# Patient Record
Sex: Male | Born: 1945 | Race: White | Hispanic: No | Marital: Married | Smoking: Former smoker
Health system: Southern US, Community
[De-identification: ages and names within clinical notes are randomized; demographics above are authoritative.]

## PROBLEM LIST (undated history)

## (undated) DIAGNOSIS — I10 Essential (primary) hypertension: Secondary | ICD-10-CM

## (undated) DIAGNOSIS — M5430 Sciatica, unspecified side: Secondary | ICD-10-CM

## (undated) DIAGNOSIS — E7800 Pure hypercholesterolemia, unspecified: Secondary | ICD-10-CM

## (undated) DIAGNOSIS — C801 Malignant (primary) neoplasm, unspecified: Secondary | ICD-10-CM

## (undated) DIAGNOSIS — E119 Type 2 diabetes mellitus without complications: Secondary | ICD-10-CM

## (undated) HISTORY — PX: CORONARY STENT PLACEMENT: SHX1402

## (undated) HISTORY — PX: OTHER SURGICAL HISTORY: SHX169

---

## 2014-03-23 ENCOUNTER — Emergency Department (HOSPITAL_COMMUNITY)
Admission: EM | Admit: 2014-03-23 | Discharge: 2014-03-23 | Disposition: A | Discharge status: Home / Self Care | Payer: Medicare Other | Attending: Emergency Medicine | Admitting: Emergency Medicine

## 2014-03-23 ENCOUNTER — Encounter (HOSPITAL_COMMUNITY): Payer: Self-pay | Admitting: Emergency Medicine

## 2014-03-23 DIAGNOSIS — Z859 Personal history of malignant neoplasm, unspecified: Secondary | ICD-10-CM | POA: Insufficient documentation

## 2014-03-23 DIAGNOSIS — E119 Type 2 diabetes mellitus without complications: Secondary | ICD-10-CM | POA: Insufficient documentation

## 2014-03-23 DIAGNOSIS — IMO0001 Reserved for inherently not codable concepts without codable children: Secondary | ICD-10-CM | POA: Insufficient documentation

## 2014-03-23 DIAGNOSIS — E785 Hyperlipidemia, unspecified: Secondary | ICD-10-CM | POA: Diagnosis not present

## 2014-03-23 DIAGNOSIS — Z87891 Personal history of nicotine dependence: Secondary | ICD-10-CM | POA: Diagnosis not present

## 2014-03-23 DIAGNOSIS — Z9889 Other specified postprocedural states: Secondary | ICD-10-CM | POA: Diagnosis not present

## 2014-03-23 DIAGNOSIS — M791 Myalgia, unspecified site: Secondary | ICD-10-CM

## 2014-03-23 DIAGNOSIS — M79609 Pain in unspecified limb: Secondary | ICD-10-CM | POA: Insufficient documentation

## 2014-03-23 DIAGNOSIS — I10 Essential (primary) hypertension: Secondary | ICD-10-CM | POA: Diagnosis not present

## 2014-03-23 DIAGNOSIS — R209 Unspecified disturbances of skin sensation: Secondary | ICD-10-CM | POA: Diagnosis not present

## 2014-03-23 DIAGNOSIS — Z8739 Personal history of other diseases of the musculoskeletal system and connective tissue: Secondary | ICD-10-CM | POA: Insufficient documentation

## 2014-03-23 HISTORY — DX: Essential (primary) hypertension: I10

## 2014-03-23 HISTORY — DX: Type 2 diabetes mellitus without complications: E11.9

## 2014-03-23 HISTORY — DX: Malignant (primary) neoplasm, unspecified: C80.1

## 2014-03-23 HISTORY — DX: Sciatica, unspecified side: M54.30

## 2014-03-23 HISTORY — DX: Pure hypercholesterolemia, unspecified: E78.00

## 2014-03-23 NOTE — Progress Notes (Signed)
VASCULAR LAB PRELIMINARY  PRELIMINARY  PRELIMINARY  PRELIMINARY  Bilateral lower extremity venous Dopplers completed.    Preliminary report:  There is no DVT or SVT noted in the bilateral lower extremities.  Incidentally, lower extremity arteries show no significant plaque and no stenosis.    Johnny Bailey, RVT 03/23/2014, 3:08 PM

## 2014-03-23 NOTE — ED Notes (Signed)
Pt complains of bilateral leg cramping, seen md for same and had labs drawn and K was fine, pt sts almost feels like pulled groin

## 2014-03-23 NOTE — ED Notes (Signed)
Pt returned to room for vascular

## 2014-03-23 NOTE — ED Provider Notes (Signed)
Medical screening examination/treatment/procedure(s) were performed by non-physician practitioner and as supervising physician I was immediately available for consultation/collaboration.  Richarda Blade, MD 03/23/14 [phone]

## 2014-03-23 NOTE — ED Notes (Signed)
Called vascular studies and updated pt on status

## 2014-03-23 NOTE — ED Provider Notes (Signed)
CSN: [id number]     Arrival date & time 03/23/14  1108 History  This chart was scribed for non-physician practitioner, Alecia Lemming, PA-C working with Richarda Blade, MD, by Erling Conte, ED Scribe. This patient was seen in room TR11C/TR11C and the patient's care was started at 11:38 AM.    Chief Complaint  Patient presents with  . Leg Pain      The history is provided by the patient. No language interpreter was used.   HPI Comments: Johnny Bailey is a 68 y.o. male with a h/o HTN, high cholesterol, DM, cancer, coronary stent placement and sciatica who presents to the Emergency Department complaining of mild, gradually worsening, intermittent, "4/10",  bilateral leg cramps for 3 months. He states left side is worse than the right side. He states he is having associated mild numbness in his bilateral thighs. He states that the pain is exacerbated by movement and it has become a nuisance becomes he has trouble getting in and out of his car and other normal activities. States that their is some mild relief with lying down but not a lot.  He states that he saw his PCP for the same and had lab drawn and his Potassium level was fine and he was unable to get an X-ray scheduled due to the x-ray tech not being in the office.  He denies any recent travel or long car rides. Denies taking any medication for the pain. He states that he regularly takes cholesterol medication but is unable to recall the name but states he has been taking it for a while. He denies any change in urine, weakness, shortness of breath, edema in bilateral legs, back pain, fever, chills, or chest pain. Reports that he stays adequately hydrated throughout the day.    Past Medical History  Diagnosis Date  . Hypertension   . High cholesterol   . Diabetes mellitus without complication   . Cancer   . Sciatica    Past Surgical History  Procedure Laterality Date  . Coronary stent placement     History reviewed. No  pertinent family history. History  Substance Use Topics  . Smoking status: Former Research scientist (life sciences)  . Smokeless tobacco: Not on file  . Alcohol Use: Yes    Review of Systems  Constitutional: Negative for fever and chills.  HENT: Negative for rhinorrhea and sore throat.   Eyes: Negative for redness.  Respiratory: Negative for cough and shortness of breath.   Cardiovascular: Negative for chest pain and leg swelling.  Gastrointestinal: Negative for nausea, vomiting, abdominal pain and diarrhea.  Genitourinary: Negative for dysuria and hematuria.  Musculoskeletal: Positive for gait problem (cramps and pain when walking) and myalgias (bilateral leg cramping). Negative for back pain.  Skin: Negative for rash.  Neurological: Positive for numbness (mild in bilateral thighs). Negative for weakness and headaches.      Allergies  Review of patient's allergies indicates not on file.  Home Medications   Prior to Admission medications   Medication Sig Start Date End Date Taking? Authorizing Provider  MORPHINE SULFATE, PF, IV Inject into the vein.   Yes Historical Provider, MD   BP 155/92  Pulse 82  Temp(Src) 98.1 F (36.7 C) (Oral)  Resp 18  Ht 6\' 1"  (1.854 m)  Wt 280 lb (127.007 kg)  BMI 36.95 kg/m2  SpO2 98%  Physical Exam  Nursing note and vitals reviewed. Constitutional: He is oriented to person, place, and time. He appears well-developed and well-nourished. No distress.  HENT:  Head: Normocephalic and atraumatic.  Eyes: Conjunctivae and EOM are normal. Right eye exhibits no discharge. Left eye exhibits no discharge.  Neck: Normal range of motion. Neck supple. No tracheal deviation present.  Cardiovascular: Normal rate, regular rhythm and normal heart sounds.   No murmur heard. Pulses:      Dorsalis pedis pulses are 2+ on the right side, and 2+ on the left side.       Posterior tibial pulses are 2+ on the right side, and 2+ on the left side.  Pulmonary/Chest: Effort normal and  breath sounds normal. No respiratory distress.  Abdominal: Soft. There is no tenderness.  Musculoskeletal: Normal range of motion. He exhibits no tenderness.  No pain to the paraspinous muscles or of the spine itself.  Neurological: He is alert and oriented to person, place, and time.  Skin: Skin is warm and dry.  Psychiatric: He has a normal mood and affect. His behavior is normal.    ED Course  Procedures (including critical care time)  DIAGNOSTIC STUDIES: Oxygen Saturation is 98% on RA, normal by my interpretation.    COORDINATION OF CARE: 11:49 PM- Will order [place] of LE venous duplex bilaterally. Pt advised of plan for treatment and pt agrees.  Doppler was negative. Patient informed. He will followup with PCP for continued evaluation.   Labs Review Labs Reviewed - No data to display  Imaging Review No results found.   EKG Interpretation None      Vital signs reviewed and are as follows: Filed Vitals:   03/23/14 1540  BP: 133/100  Pulse: 86  Temp:   Resp: 18    MDM   Final diagnoses:  Myalgia   Patient with myalgias of bilateral lower extremities. Patient had previous blood work done by PCP which included a normal CK, normal electrolytes. Patient brings with him orders from his PCP for lower extremity Doppler, arterial Doppler/ABI. Patient was unable to get the scheduled for today and he is worried about clots in his legs, so he came to the emergency department for evaluation. Venous Doppler was negative. Incidentally, no stenosis or plaque noted in arterial circulation of legs. Patient symptoms do not suggest claudication. Do not really suggest radicular pain either. Patient is on a statin medication. At this point we discussed that there is no other emergent studies needed at this time. He will follow up with his PCP for further evaluation.   I personally performed the services described in this documentation, which was scribed in my presence. The recorded  information has been reviewed and is accurate.      Carlisle Cater, PA-C 03/23/14 1700

## 2014-03-23 NOTE — Discharge Instructions (Signed)
Please read and follow all provided instructions.  Your diagnoses today include:  1. Myalgia    Tests performed today include:  Ultrasound of your legs - no blood clots or blockages  Vital signs. See below for your results today.   Medications prescribed:   None  Take any prescribed medications only as directed.  Home care instructions:  Follow any educational materials contained in this packet.  BE VERY CAREFUL not to take multiple medicines containing Tylenol (also called acetaminophen). Doing so can lead to an overdose which can damage your liver and cause liver failure and possibly death.   Follow-up instructions: Please follow-up with your primary care provider in the next 3 days for further evaluation of your symptoms.   Return instructions:   Please return to the Emergency Department if you experience worsening symptoms.   Please return if you have any other emergent concerns.  Additional Information:  Your vital signs today were: BP 155/92   Pulse 82   Temp(Src) 98.1 F (36.7 C) (Oral)   Resp 18   Ht 6\' 1"  (1.854 m)   Wt 280 lb (127.007 kg)   BMI 36.95 kg/m2   SpO2 98% If your blood pressure (BP) was elevated above 135/85 this visit, please have this repeated by your doctor within one month. --------------

## 2014-04-01 ENCOUNTER — Encounter: Payer: Self-pay | Admitting: Neurology

## 2014-04-01 ENCOUNTER — Ambulatory Visit (INDEPENDENT_AMBULATORY_CARE_PROVIDER_SITE_OTHER): Payer: Medicare Other | Admitting: Neurology

## 2014-04-01 VITALS — BP 139/84 | HR 84 | Temp 98.8°F | Ht 76.0 in | Wt 274.0 lb

## 2014-04-01 DIAGNOSIS — G729 Myopathy, unspecified: Secondary | ICD-10-CM

## 2014-04-01 DIAGNOSIS — R52 Pain, unspecified: Secondary | ICD-10-CM

## 2014-04-01 DIAGNOSIS — R252 Cramp and spasm: Secondary | ICD-10-CM

## 2014-04-01 NOTE — Patient Instructions (Signed)
We will do further testing for your thigh pain: nerve and muscle electrical testing, blood work and urine test, and will consider a muscle biopsy down the road.

## 2014-04-01 NOTE — Progress Notes (Signed)
Subjective:    Patient ID: Johnny Bailey is a 68 y.o. male.  HPI    Star Age, MD, PhD [facility] [address] [place], [place] [address]  Dear Dr. Harmon Pier,   I saw your patient, Johnny Bailey, upon your kind request in my neurologic clinic today for initial consultation of his leg pain. The patient is unaccompanied today. As you know, Johnny Bailey is a very friendly 69 year old right-handed gentleman with a complex medical history of hypertension, hyperlipidemia, diabetes, sciatica, cancer, and coronary artery disease, status post stent placement who presented on 03/23/2014 to the emergency room with a history of intermittent and progressive leg cramps for the past 3 to 4 months. He feels that the left side is worse than the right. He felt mild numbness in his bilateral thighs. He describes exacerbating factors of movement and while overall his symptoms are more of a nuisance he feels he is getting weaker. He did have bilateral lower extremity Doppler studies which showed no clot. I reviewed labs from your office from 03/18/2014 which included a normal BMP with the exception of borderline low sodium at 134, ESR borderline elevated at 19, normal total CK, normal CRP, normal aldolase, normal CBC with differential which was done on 12/24/2013, he had a normal cholesterol panel and negative UA in May as well as normal hemoglobin A1c at 5.5. Sx started gradually some months ago and are primarily crampy pain or discomfort in the thighs and calves and he has not noted any wasting, no fasciculations, no rash, no numbness, no burning, but has at times severe pain, particularly when climbing up stairs. He has no FHx of neuropathy or myopathy. 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 He quit smoking and has largely reduced his caffeine, he drinks a cocktail plus an occasional beer daily.    He is overweight and has had no recent wt changes.  He has no UE symptoms and pain is the biggest player. He also reports a longer standing history of lower back pain, particularly with right sciatica symptoms.              His Past Medical History Is Significant For: Past Medical History  Diagnosis Date  . Hypertension   . High cholesterol   . Diabetes mellitus without complication   . Sciatica   . Cancer     bladder    His Past Surgical History Is Significant For: Past Surgical History  Procedure Laterality Date  . Coronary stent placement    . Bladder cancer      His Family History Is Significant For: No family history on file.  His Social History Is Significant For: History   Social History  . Marital Status: Married    Spouse Name: Lelan Pons    Number of Children: 4  . Years of Education: BME   Occupational History  .      retired   Social History Main Topics  . Smoking status: Former Research scientist (life sciences)  . Smokeless tobacco: Never Used  . Alcohol Use: Yes     Comment: moderate  . Drug Use: No  . Sexual Activity: None   Other Topics Concern  . None   Social History Narrative   No caffeine,is right handed and resides with wife  His Allergies Are:  Allergies  Allergen Reactions  . Morphine And Related Itching and Other (See Comments)    IV injection gave red streaks on arm  :   His Current Medications Are:  Outpatient Encounter Prescriptions as of 04/01/2014  Medication Sig  . aspirin 325 MG tablet Take 325 mg by mouth daily.  Marland Kitchen atorvastatin (LIPITOR) 40 MG tablet Take 40 mg by mouth daily.  . benazepril (LOTENSIN) 20 MG tablet Take 20 mg by mouth daily.  . Flaxseed, Linseed, (FLAX SEED OIL PO) Take 1 capsule by mouth daily.  . hydrochlorothiazide (HYDRODIURIL) 25 MG tablet Take 25 mg by mouth daily.  . metoprolol (LOPRESSOR) 50 MG tablet Take 50 mg  by mouth daily.  . Omega-3 Fatty Acids (FISH OIL PO) Take 1 capsule by mouth daily.  Marland Kitchen omeprazole (PRILOSEC) 20 MG capsule Take 20 mg by mouth daily.  :   Review of Systems:  Out of a complete 14 point review of systems, all are reviewed and negative with the exception of these symptoms as listed below:   Review of Systems  Musculoskeletal:       Joint pain, cramps, aching muscles    Objective:  Neurologic Exam  Physical Exam Physical Examination:   Filed Vitals:   04/01/14 0849  BP: 139/84  Pulse: 84  Temp: 98.8 F (37.1 C)    General Examination: The patient is a very pleasant 68 y.o. male in no acute distress. He appears well-developed and well-nourished and adequately groomed.   HEENT: Normocephalic, atraumatic, pupils are equal, round and reactive to light and accommodation. Funduscopic exam is normal with sharp disc margins noted. Extraocular tracking is good without limitation to gaze excursion or nystagmus noted. Normal smooth pursuit is noted. Hearing is grossly intact. Tympanic membranes are clear bilaterally. Face is symmetric with normal facial animation and normal facial sensation. Speech is clear with no dysarthria noted. There is no hypophonia. There is no lip, neck/head, jaw or voice tremor. Neck is supple with full range of passive and active motion. There are no carotid bruits on auscultation. Oropharynx exam reveals: mild mouth dryness, adequate dental hygiene and moderate airway crowding. Mallampati is class II. Tongue protrudes centrally and palate elevates symmetrically.   Chest: Clear to auscultation without wheezing, rhonchi or crackles noted.  Heart: S1+S2+0, regular and normal without murmurs, rubs or gallops noted.   Abdomen: Soft, non-tender and non-distended with normal bowel sounds appreciated on auscultation.  Extremities: There is no pitting edema in the distal lower extremities bilaterally. Pedal pulses are intact.  Skin: Warm and dry without  trophic changes noted. There are no varicose veins.  Musculoskeletal: exam reveals no obvious joint deformities, tenderness or joint swelling or erythema.   Neurologically:  Mental status: The patient is awake, alert and oriented in all 4 spheres. His immediate and remote memory, attention, language skills and fund of knowledge are appropriate. There is no evidence of aphasia, agnosia, apraxia or anomia. Speech is clear with normal prosody and enunciation. Thought process is linear. Mood is normal and affect is normal.  Cranial nerves II - XII are as described above under HEENT exam. In addition: shoulder shrug is normal with equal shoulder height noted. Motor exam: Normal bulk, strength and tone is noted. I do not appreciate any tremors, no drift, Romberg is negative, upper body strength is completely normal, lower extremity strength is just impaired because of significant pain reported in the hip flexors only. Otherwise he has no significant pain. I do  not see any focal atrophy, no fasciculations, and no muscle cramping. Reflexes are 2+ throughout. Babinski: Toes are flexor bilaterally. Fine motor skills and coordination: intact with normal finger taps, normal hand movements, normal rapid alternating patting, normal foot taps and normal foot agility.  Cerebellar testing: No dysmetria or intention tremor on finger to nose testing. Heel to shin is very difficult for him due to pain. He is barely able to bring up his heels to the mid shin areas. There is no truncal or gait ataxia.  Sensory exam: intact to light touch, pinprick, vibration, temperature sense in the upper and lower extremities.  Gait, station and balance: He stands with difficulty. No veering to one side is noted. No leaning to one side is noted. Posture is age-appropriate and stance is narrow based. Gait shows reduced pace and slightly reduced stride length. He seems to be in pain when walking and has a limp but not consistently on one side.  Interestingly pain seems to be a very big players year as opposed to weakness. normal stride length and normal pace. No problems turning are noted. He turns en bloc. Tandem walk is quite difficult for him but he seems to be in pain more than anything else. Balance seems to be preserved. Intact toe and heel stance is noted.   Assessment and Plan:    In summary, Ceaser Ebeling is a very pleasant 68 y.o.-year old male with a history of hypertension, hyperlipidemia, diabetes, sciatica, cancer, and coronary artery disease, status post stent placement, who reports a 3-4 month history of progressive thigh pain. Symptoms are not in keeping with meralgia paresthetica. He has resultant mild weakness in the hip flexors but on examination pain is a very big component and stands out of proportion to his weakness were any other signs. In fact he has a fairly nonfocal exam otherwise. I am somewhat perplexed as to his symptomatology. He has no family history of nerve or muscle disease, he is not diabetic. CK levels were fine. Of course there are rare instances of inflammatory muscle disease as such as inclusion body myositis that can present with primarily hip flexor and thigh muscle weakness and pain but weakness is usually a bigger component and it also typically affects finger intrinsic muscles. He has no upper extremity symptoms which is interesting. Reflexes are preserved. I had a long chat with the patient about my findings and his symptoms. I think we should proceed with further testing in the form of lumbar spine MRI as he reports right-sided sciatica, EMG and nerve conduction velocity testing, additional blood testing for inflammatory markers, autoimmune diseases, SPEP and IFE, and heavy metal testing. We will be calling him with his test results as we get that back. Down the Road we may consider muscle biopsy. At this juncture, for symptomatic treatment he has had reasonable success with Motrin. I did not  suggest any new medication and would like to await at least some test results. I will see him back soon.  Thank you very much for allowing me to participate in the care of this nice patient. If I can be of any further assistance to you please do not hesitate to call me at [phone].  Sincerely,   Star Age, MD, PhD

## 2014-04-02 ENCOUNTER — Other Ambulatory Visit: Payer: Self-pay | Admitting: Neurology

## 2014-04-02 ENCOUNTER — Ambulatory Visit
Admission: RE | Admit: 2014-04-02 | Discharge: 2014-04-02 | Disposition: A | Discharge status: Home / Self Care | Payer: Medicare Other | Source: Ambulatory Visit | Attending: Neurology | Admitting: Neurology

## 2014-04-02 DIAGNOSIS — G729 Myopathy, unspecified: Secondary | ICD-10-CM

## 2014-04-02 DIAGNOSIS — R252 Cramp and spasm: Secondary | ICD-10-CM

## 2014-04-02 DIAGNOSIS — R52 Pain, unspecified: Secondary | ICD-10-CM

## 2014-04-03 NOTE — Progress Notes (Signed)
Quick Note:  I called pt and relayed the results of the lab tests. He turned in urine specimen for heavy metals this am. He is asking about getting MRI in [place] at the [facility] as well he is claustrophobic and I mentioned can get xanax called in to [facility] in [place] if ok per Dr.Athar. Pt verbalized understanding. ______

## 2014-04-03 NOTE — Progress Notes (Signed)
Rx has been phoned in to the pharmacy.  I called the patient to advise. Got no answer, left message.

## 2014-04-03 NOTE — Progress Notes (Signed)
Okay to call in Xanax sure 0.5 mg strength for MRI due to claustrophobia. May take one pill on call to MRI, may repeat x1, please remind patient not to drive after his MRI he is not used to taking Xanax. He may have somebody bring him and pick them up to the MRI. Jessica: Please call in Xanax her 0.5 mg, 1-2 pills as needed for MRI, #2 with no refills

## 2014-04-03 NOTE — Progress Notes (Signed)
Quick Note:  Please call and advise the patient that the recent labs we checked were within normal limits. We checked thyroid function, inflammatory markers, sedimentation rate, Lyme disease antibodies and heavy metals. No further action is required on these tests at this time. Please remind patient to keep any upcoming appointments or tests and to call us with any interim questions, concerns, problems or updates. Thanks,  Star Age, MD, PhD    ______

## 2014-04-06 LAB — IFE AND PE, SERUM
Albumin SerPl Elph-Mcnc: 3.6 g/dL (ref 3.2–5.6)
Albumin/Glob SerPl: 1.2 (ref 0.7–2.0)
Alpha 1: 0.3 g/dL (ref 0.1–0.4)
Alpha2 Glob SerPl Elph-Mcnc: 0.7 g/dL (ref 0.4–1.2)
B-GLOBULIN SERPL ELPH-MCNC: 1 g/dL (ref 0.6–1.3)
GAMMA GLOB SERPL ELPH-MCNC: 1.3 g/dL (ref 0.5–1.6)
GLOBULIN, TOTAL: 3.2 g/dL (ref 2.0–4.5)
IGA/IMMUNOGLOBULIN A, SERUM: 229 mg/dL (ref 91–414)
IGG (IMMUNOGLOBIN G), SERUM: 1092 mg/dL (ref 700–1600)
IgM (Immunoglobulin M), Srm: 229 mg/dL (ref 40–230)
Total Protein: 6.8 g/dL (ref 6.0–8.5)

## 2014-04-06 LAB — TSH: TSH: 1.36 u[IU]/mL (ref 0.450–4.500)

## 2014-04-06 LAB — COPPER, SERUM: COPPER: 116 ug/dL (ref 72–166)

## 2014-04-06 LAB — ANA W/REFLEX: Anti Nuclear Antibody(ANA): NEGATIVE

## 2014-04-06 LAB — HEAVY METALS PROFILE II, BLOOD
Arsenic: 3 ug/L (ref 2–23)
Cadmium: NOT DETECTED ug/L (ref 0.0–1.2)
Lead, Blood: 2 ug/dL (ref 0–19)
Mercury: 1.1 ug/L (ref 0.0–14.9)

## 2014-04-06 LAB — LYME, TOTAL AB TEST/REFLEX

## 2014-04-06 LAB — RPR: SYPHILIS RPR SCR: NONREACTIVE

## 2014-04-07 ENCOUNTER — Ambulatory Visit (INDEPENDENT_AMBULATORY_CARE_PROVIDER_SITE_OTHER): Payer: Medicare Other | Admitting: Diagnostic Neuroimaging

## 2014-04-07 ENCOUNTER — Telehealth: Payer: Self-pay | Admitting: *Deleted

## 2014-04-07 ENCOUNTER — Encounter (INDEPENDENT_AMBULATORY_CARE_PROVIDER_SITE_OTHER): Payer: Self-pay

## 2014-04-07 DIAGNOSIS — G729 Myopathy, unspecified: Secondary | ICD-10-CM

## 2014-04-07 DIAGNOSIS — R252 Cramp and spasm: Secondary | ICD-10-CM

## 2014-04-07 DIAGNOSIS — Z0289 Encounter for other administrative examinations: Secondary | ICD-10-CM

## 2014-04-07 DIAGNOSIS — R52 Pain, unspecified: Secondary | ICD-10-CM

## 2014-04-07 LAB — HEAVY METALS PROFILE II, URINE
Arsenic Ur: NOT DETECTED ug/L (ref 0–50)
Arsenic(Inorganic),U: NOT DETECTED ug/L (ref 0–19)
Cadmium, Ur: NOT DETECTED ug/L
Creatinine(Crt),U: 0.98 g/L (ref 0.30–3.00)
Lead, Rand Ur: NOT DETECTED ug/L (ref 0–49)
Mercury, Ur: NOT DETECTED ug/L (ref 0–19)

## 2014-04-07 NOTE — Telephone Encounter (Signed)
Called patient with results of NCV/EMG. He was very grateful for the results so promptly given. He wants all involved to know that he was satisfied with his care here and a big thank you to all.

## 2014-04-07 NOTE — Procedures (Signed)
   [facility]  NCS (NERVE CONDUCTION STUDY) WITH EMG (ELECTROMYOGRAPHY) REPORT   STUDY DATE: 04/07/14 PATIENT NAME: Johnny Bailey DOB: 07-27-1946 MRN: [id number]  ORDERING CLINICIAN: Star Age, MD PhD   TECHNOLOGIST: Laretta Alstrom  ELECTROMYOGRAPHER: Earlean Polka. Penumalli, MD  CLINICAL INFORMATION: 68 year old male with bilateral leg pain.  FINDINGS: NERVE CONDUCTION STUDY: Bilateral peroneal and tibial motor responses and F-wave latencies are normal. Bilateral peroneal sensory responses are normal.  NEEDLE ELECTROMYOGRAPHY: Right lower extremity gastrocnemius shows 1+ positive sharp waves and fibrillation potentials at rest and normal motor unit her treatment on exertion. Right gluteus medius, iliopsoas, vastus medialis, just anterior muscles are normal. Right L4-5 paraspinal muscles demonstrate 2+ positive sharp waves.  IMPRESSION:  Mildly abnormal imaging: 1. 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 2. No evidence of underlying widespread polyneuropathy or myopathy at this time.   INTERPRETING PHYSICIAN:  Penni Bombard, MD Certified in Neurology, Neurophysiology and Neuroimaging  [facility] [address], [place], [place] [address] [phone]

## 2014-04-07 NOTE — Progress Notes (Signed)
Quick Note:  Please call patient regarding his recent EMG and nerve conduction study. Thankfully there is no evidence of any chronic neuropathy or muscle related disease which recall myopathy. There is some evidence that he may have lower back disease pinching on one of the nerves in the back which we call radiculopathy, this is on the right side. I think a lumbar spine MRI would help and I have ordered the scan. I'm not sure if he is scheduled for it yet.  Star Age, MD, PhD [facility] ([facility])  ______

## 2014-04-07 NOTE — Progress Notes (Signed)
Mr. Auletta thanks both providers for all that they have done. He was especially grateful to receive his results so promptly.

## 2014-04-15 ENCOUNTER — Telehealth: Payer: Self-pay | Admitting: Neurology

## 2014-04-15 DIAGNOSIS — M479 Spondylosis, unspecified: Secondary | ICD-10-CM

## 2014-04-15 DIAGNOSIS — M545 Low back pain, unspecified: Secondary | ICD-10-CM

## 2014-04-15 DIAGNOSIS — R252 Cramp and spasm: Secondary | ICD-10-CM

## 2014-04-15 NOTE — Telephone Encounter (Signed)
Called and left VM message for return call back to share Dr Guadelupe Sabin message concerning MRI lumbar spine results

## 2014-04-15 NOTE — Telephone Encounter (Signed)
I received the report on this patient's MRI lumbar spine without contrast performed at [facility] on 04/08/2014 (read by Dr. 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 Moderate bilateral foraminal stenosis with possible nerve root impingement, particularly on the left, #3: Moderate central canal stenosis at L4-5. Moderate bilateral foraminal stenosis left greater than right with possible nerve impingement, #4: Moderate left and mild right foraminal stenosis without definite nerve root impingement at L5-S1, #5: Mild bilateral foraminal stenosis at L2-3.   Ammie: Please call patient back regarding his MRI lumbar spine results. There is evidence of degenerative changes of the spine. While he does not have any evidence of herniated disc, there is some evidence that he may have pinched nerves and what we call spinal stenosis which is a narrowing of the spinal canal. All in all, the findings are not critical or severe but it may be in his best interest to consult with a spine doctor, particularly an orthopedic surgeon. If he does not have an orthopedic doctor, we can make a referral. If you need see somebody locally, his primary care physician is probably the best resource to refer him. Please advise patient and let me know if he needs me to make a referral.

## 2014-04-16 NOTE — Telephone Encounter (Signed)
Not sure what to do for leg pain. Let's see what the ortho MD will say. In the interim, he can continue using Motrin as needed, perhaps heat pad or cold pack as tolerated.

## 2014-04-16 NOTE — Telephone Encounter (Signed)
Spoke to patient and relayed MRI lumbar spine results, per Dr. Rexene Alberts.  The patient verbalized understanding and would like a referral to an orthopedic surgeon, prefers them to be associated with [facility] hospital.  Patient also relayed that his legs are still very painful and having severe cramps, would like the doctor to advise what he can do.

## 2014-04-17 NOTE — Telephone Encounter (Signed)
Spoke to patient and relayed the doctors recommendation.  He wanted to say thank you for the prompt attention to his requests.  Also wanted to let the doctor know that he sometimes uses a tennis ball against the wall and rolls across it, and the helps relieve the pain, or he rolls back and forth over his hand while in bed and that works also.

## 2014-05-15 ENCOUNTER — Telehealth: Payer: Self-pay | Admitting: Neurology

## 2014-05-15 NOTE — Telephone Encounter (Signed)
Called office and they said that referral was not received , refaxed referral 05/15/14,informed patient

## 2014-05-15 NOTE — Telephone Encounter (Signed)
Patient calling to check on the status of her orthopaedic referral, states that he got a call a month ago saying that the referral has been made but no one has ever contacted him about getting scheduled. Please return call and advise.

## 2014-05-19 NOTE — Telephone Encounter (Signed)
Patient has an appointment with Dr Lynne Leader 06/09/14@2  :00,patient is aware

## 2014-05-20 ENCOUNTER — Telehealth: Payer: Self-pay | Admitting: Neurology

## 2014-05-20 NOTE — Telephone Encounter (Signed)
Called patient concerning a CD to take with him for his appt. On 06/09/14 ([place]), explained that we did not have one and to check with his referring physician, he verbalized understanding and said that he would.

## 2014-07-15 ENCOUNTER — Telehealth: Payer: Self-pay | Admitting: Neurology

## 2014-07-15 NOTE — Telephone Encounter (Signed)
Patient has cancelled his 09/10/14 appt , stated that since his last visit , has found the problem (spinal stenosis), being treated and is feeling much better, a little trouble going up and down stairs but that's ok. He is wanting to know if he should reschedule the f/u . He also wanted to thank you and everyone for all the kindness and phone calls shown toward him,said no other offices does that.

## 2014-09-10 ENCOUNTER — Ambulatory Visit: Payer: Medicare Other | Admitting: Neurology

## 2016-09-11 ENCOUNTER — Emergency Department (HOSPITAL_COMMUNITY): Payer: Medicare Other

## 2016-09-11 ENCOUNTER — Emergency Department (HOSPITAL_COMMUNITY)
Admission: EM | Admit: 2016-09-11 | Discharge: 2016-09-11 | Disposition: A | Discharge status: Home / Self Care | Payer: Medicare Other | Attending: Emergency Medicine | Admitting: Emergency Medicine

## 2016-09-11 ENCOUNTER — Encounter (HOSPITAL_COMMUNITY): Payer: Self-pay | Admitting: Emergency Medicine

## 2016-09-11 DIAGNOSIS — Z955 Presence of coronary angioplasty implant and graft: Secondary | ICD-10-CM | POA: Diagnosis not present

## 2016-09-11 DIAGNOSIS — Z79899 Other long term (current) drug therapy: Secondary | ICD-10-CM | POA: Diagnosis not present

## 2016-09-11 DIAGNOSIS — E119 Type 2 diabetes mellitus without complications: Secondary | ICD-10-CM | POA: Diagnosis not present

## 2016-09-11 DIAGNOSIS — I10 Essential (primary) hypertension: Secondary | ICD-10-CM | POA: Insufficient documentation

## 2016-09-11 DIAGNOSIS — Z87891 Personal history of nicotine dependence: Secondary | ICD-10-CM | POA: Insufficient documentation

## 2016-09-11 DIAGNOSIS — J069 Acute upper respiratory infection, unspecified: Secondary | ICD-10-CM | POA: Diagnosis not present

## 2016-09-11 DIAGNOSIS — R05 Cough: Secondary | ICD-10-CM | POA: Diagnosis present

## 2016-09-11 DIAGNOSIS — Z8551 Personal history of malignant neoplasm of bladder: Secondary | ICD-10-CM | POA: Diagnosis not present

## 2016-09-11 LAB — CBC WITH DIFFERENTIAL/PLATELET
BASOS PCT: 0 %
Basophils Absolute: 0 10*3/uL (ref 0.0–0.1)
EOS ABS: 0 10*3/uL (ref 0.0–0.7)
Eosinophils Relative: 0 %
HCT: 45.2 % (ref 39.0–52.0)
Hemoglobin: 15.6 g/dL (ref 13.0–17.0)
Lymphocytes Relative: 7 %
Lymphs Abs: 0.5 10*3/uL — ABNORMAL LOW (ref 0.7–4.0)
MCH: 31 pg (ref 26.0–34.0)
MCHC: 34.5 g/dL (ref 30.0–36.0)
MCV: 89.9 fL (ref 78.0–100.0)
MONOS PCT: 5 %
Monocytes Absolute: 0.4 10*3/uL (ref 0.1–1.0)
NEUTROS PCT: 88 %
Neutro Abs: 6.5 10*3/uL (ref 1.7–7.7)
Platelets: 159 10*3/uL (ref 150–400)
RBC: 5.03 MIL/uL (ref 4.22–5.81)
RDW: 12.7 % (ref 11.5–15.5)
WBC: 7.4 10*3/uL (ref 4.0–10.5)

## 2016-09-11 LAB — BASIC METABOLIC PANEL
Anion gap: 10 (ref 5–15)
BUN: 21 mg/dL — ABNORMAL HIGH (ref 6–20)
CALCIUM: 9.2 mg/dL (ref 8.9–10.3)
CO2: 24 mmol/L (ref 22–32)
CREATININE: 0.91 mg/dL (ref 0.61–1.24)
Chloride: 101 mmol/L (ref 101–111)
GFR calc non Af Amer: >60 mL/min (ref >60)
Glucose, Bld: 111 mg/dL — ABNORMAL HIGH (ref 65–99)
Potassium: 3.8 mmol/L (ref 3.5–5.1)
Sodium: 135 mmol/L (ref 135–145)

## 2016-09-11 LAB — I-STAT TROPONIN, ED: TROPONIN I, POC: 0 ng/mL (ref 0.00–0.08)

## 2016-09-11 LAB — I-STAT CG4 LACTIC ACID, ED: Lactic Acid, Venous: 1.48 mmol/L (ref 0.5–1.9)

## 2016-09-11 MED ORDER — ACETAMINOPHEN 325 MG PO TABS
ORAL_TABLET | ORAL | Status: AC
  Filled 2016-09-11: qty 2

## 2016-09-11 MED ORDER — OSELTAMIVIR PHOSPHATE 75 MG PO CAPS: 75.0000 mg | ORAL_CAPSULE | Freq: Two times a day (BID) | ORAL | 0 refills | Status: AC

## 2016-09-11 MED ORDER — ACETAMINOPHEN 325 MG PO TABS
650.0000 mg | ORAL_TABLET | Freq: Once | ORAL | Status: AC | PRN
  Administered 2016-09-11: 650 mg via ORAL

## 2016-09-11 MED ORDER — ONDANSETRON HCL 4 MG PO TABS: 4.0000 mg | ORAL_TABLET | Freq: Four times a day (QID) | ORAL | 0 refills | Status: AC

## 2016-09-11 NOTE — ED Triage Notes (Signed)
Pt here with URI sx and fever with increased SOB

## 2016-09-11 NOTE — Discharge Instructions (Signed)

## 2016-09-11 NOTE — ED Provider Notes (Signed)
Emergency Department Provider Note   I have reviewed the triage vital signs and the nursing notes.   HISTORY  Chief Complaint URI   HPI Johnny Bailey is a 71 y.o. male patient with past medical history of bladder cancer, DM, HLD, and HTN presents to the emergency department for evaluation of URI symptoms including cough, nausea, SOB, HA, and body aches. Patient's symptoms began yesterday. He has had associated fever. No vomiting or diarrhea. No sick contacts. No exacerbating or alleviating factors. Seems to be getting progressively worse.   Past Medical History:  Diagnosis Date  . Cancer ([place])    bladder  . Diabetes mellitus without complication ([place])   . High cholesterol   . Hypertension   . Sciatica     There are no active problems to display for this patient.   Past Surgical History:  Procedure Laterality Date  . bladder cancer    . CORONARY STENT PLACEMENT      Current Outpatient Rx  . Order #: [id number] Class: Historical Med  . Order #: [id number] Class: Historical Med  . Order #: [id number] Class: Historical Med  . Order #: [id number] Class: Historical Med  . Order #: [id number] Class: Historical Med  . Order #: [id number] Class: Historical Med  . Order #: [id number] Class: Historical Med  . Order #: [id number] Class: Historical Med  . Order #: [id number] Class: Print  . Order #: [id number] Class: Print    Allergies Morphine and related  History reviewed. No pertinent family history.  Social History Social History  Substance Use Topics  . Smoking status: Former Research scientist (life sciences)  . Smokeless tobacco: Never Used  . Alcohol use Yes     Comment: moderate    Review of Systems  Constitutional: Positive fever/chills and body aches.  Eyes: No visual changes. ENT: Positive sore throat. Cardiovascular: Denies chest pain. Respiratory: Denies shortness of breath. Positive cough.  Gastrointestinal: No abdominal pain. Positive nausea, no vomiting.  No diarrhea.  No  constipation. Genitourinary: Negative for dysuria. Musculoskeletal: Negative for back pain. Skin: Negative for rash. Neurological: Negative for focal weakness or numbness. Positive mild HA.   10-point ROS otherwise negative.  ____________________________________________   PHYSICAL EXAM:  VITAL SIGNS: ED Triage Vitals  Enc Vitals Group     BP 09/11/16 1440 129/82     Pulse Rate 09/11/16 1440 (!) 128     Resp 09/11/16 1440 22     Temp 09/11/16 1440 102.2 F (39 C)     Temp Source 09/11/16 1440 Oral     SpO2 09/11/16 1440 94 %     Pain Score 09/11/16 1441 5   Constitutional: Alert and oriented. Well appearing and in no acute distress. Eyes: Conjunctivae are normal.  Head: Atraumatic. Nose: No congestion/rhinnorhea. Mouth/Throat: Mucous membranes are dry.  Oropharynx non-erythematous. Neck: No stridor.  No meningeal signs. Cardiovascular: Tachycardia. Good peripheral circulation. Grossly normal heart sounds.   Respiratory: Normal respiratory effort.  No retractions. Lungs CTAB. Gastrointestinal: Soft and nontender. No distention.  Musculoskeletal: No lower extremity tenderness nor edema. No gross deformities of extremities. Neurologic:  Normal speech and language. No gross focal neurologic deficits are appreciated.  Skin:  Skin is warm, dry and intact. No rash noted. Psychiatric: Mood and affect are normal. Speech and behavior are normal.  ____________________________________________   LABS (all labs ordered are listed, but only abnormal results are displayed)  Labs Reviewed  CBC WITH DIFFERENTIAL/PLATELET - Abnormal; Notable for the following:       Result Value   Lymphs Abs 0.5 (*)  All other components within normal limits  BASIC METABOLIC PANEL - Abnormal; Notable for the following:    Glucose, Bld 111 (*)    BUN 21 (*)    All other components within normal limits  I-STAT TROPOININ, ED  I-STAT CG4 LACTIC ACID, ED    ____________________________________________  RADIOLOGY  Dg Chest 2 View  Result Date: 09/11/2016 CLINICAL DATA:  Productive cough and shortness of breath. EXAM: CHEST  2 VIEW COMPARISON:  None. FINDINGS: Two views study shows hyperexpansion. Interstitial markings are diffusely coarsened with chronic features. Patchy airspace disease noted both lung bases. Cardiopericardial silhouette is at upper limits of normal for size. The visualized bony structures of the thorax are intact. IMPRESSION: Emphysema with underlying chronic interstitial changes. Bibasilar patchy alveolar disease.  Pneumonia not excluded. Electronically Signed   By: Misty Stanley M.D.   On: 09/11/2016 15:46    ____________________________________________   PROCEDURES  Procedure(s) performed:   Procedures  None ____________________________________________   INITIAL IMPRESSION / ASSESSMENT AND PLAN / ED COURSE  Pertinent labs & imaging results that were available during my care of the patient were reviewed by me and considered in my medical decision making (see chart for details).  Patient with URI and flu-like symptoms. Normal lactate and equivocal CXR. Clinically my suspicion for flu is high. No hypoxemia. No clear indication for admission. With symptoms starting in the last 48 hours the patient is a candidate for Tamiflu. I have prescribed this and advised rest, hydration, and isolation from others. Discussed that if symptoms worsen he should return immediately and developing PNA in the setting of flu is possible. He verbalizes understanding.   At this time, I do not feel there is any life-threatening condition present. I have reviewed and discussed all results (EKG, imaging, lab, urine as appropriate), exam findings with patient. I have reviewed nursing notes and appropriate previous records.  I feel the patient is safe to be discharged home without further emergent workup. Discussed usual and customary return  precautions. Patient and family (if present) verbalize understanding and are comfortable with this plan.  Patient will follow-up with their primary care provider. If they do not have a primary care provider, information for follow-up has been provided to them. All questions have been answered.  ____________________________________________  FINAL CLINICAL IMPRESSION(S) / ED DIAGNOSES  Final diagnoses:  Viral upper respiratory tract infection     MEDICATIONS GIVEN DURING THIS VISIT:  Medications  acetaminophen (TYLENOL) tablet 650 mg (650 mg Oral Given 09/11/16 1445)     NEW OUTPATIENT MEDICATIONS STARTED DURING THIS VISIT:  Discharge Medication List as of 09/11/2016  7:42 PM    START taking these medications   Details  ondansetron (ZOFRAN) 4 MG tablet Take 1 tablet (4 mg total) by mouth every 6 (six) hours., Starting Mon 09/11/2016, Print    oseltamivir (TAMIFLU) 75 MG capsule Take 1 capsule (75 mg total) by mouth every 12 (twelve) hours., Starting Mon 09/11/2016, Print         Note:  This document was prepared using Dragon voice recognition software and may include unintentional dictation errors.  Nanda Quinton, MD Emergency Medicine   Margette Fast, MD 09/12/16 1046

## 2016-09-11 NOTE — ED Notes (Signed)
Nurse spoke with patient and explained the wait time and acuity. Patient expressed frustration and nurse apologized for the waiting time. Patient continued to wait.

## 2016-09-11 NOTE — ED Notes (Signed)
Pt verbalized understanding of d/c instructions and has no further questions. Pt stable and NAD. VSS.  

## 2017-08-23 IMAGING — DX DG CHEST 2V
2 series · 2 of 2 positions shown · non-contrast
Comparison: None.

CLINICAL DATA: Productive cough and shortness of breath.

EXAM:
CHEST  2 VIEW

[chest pa]
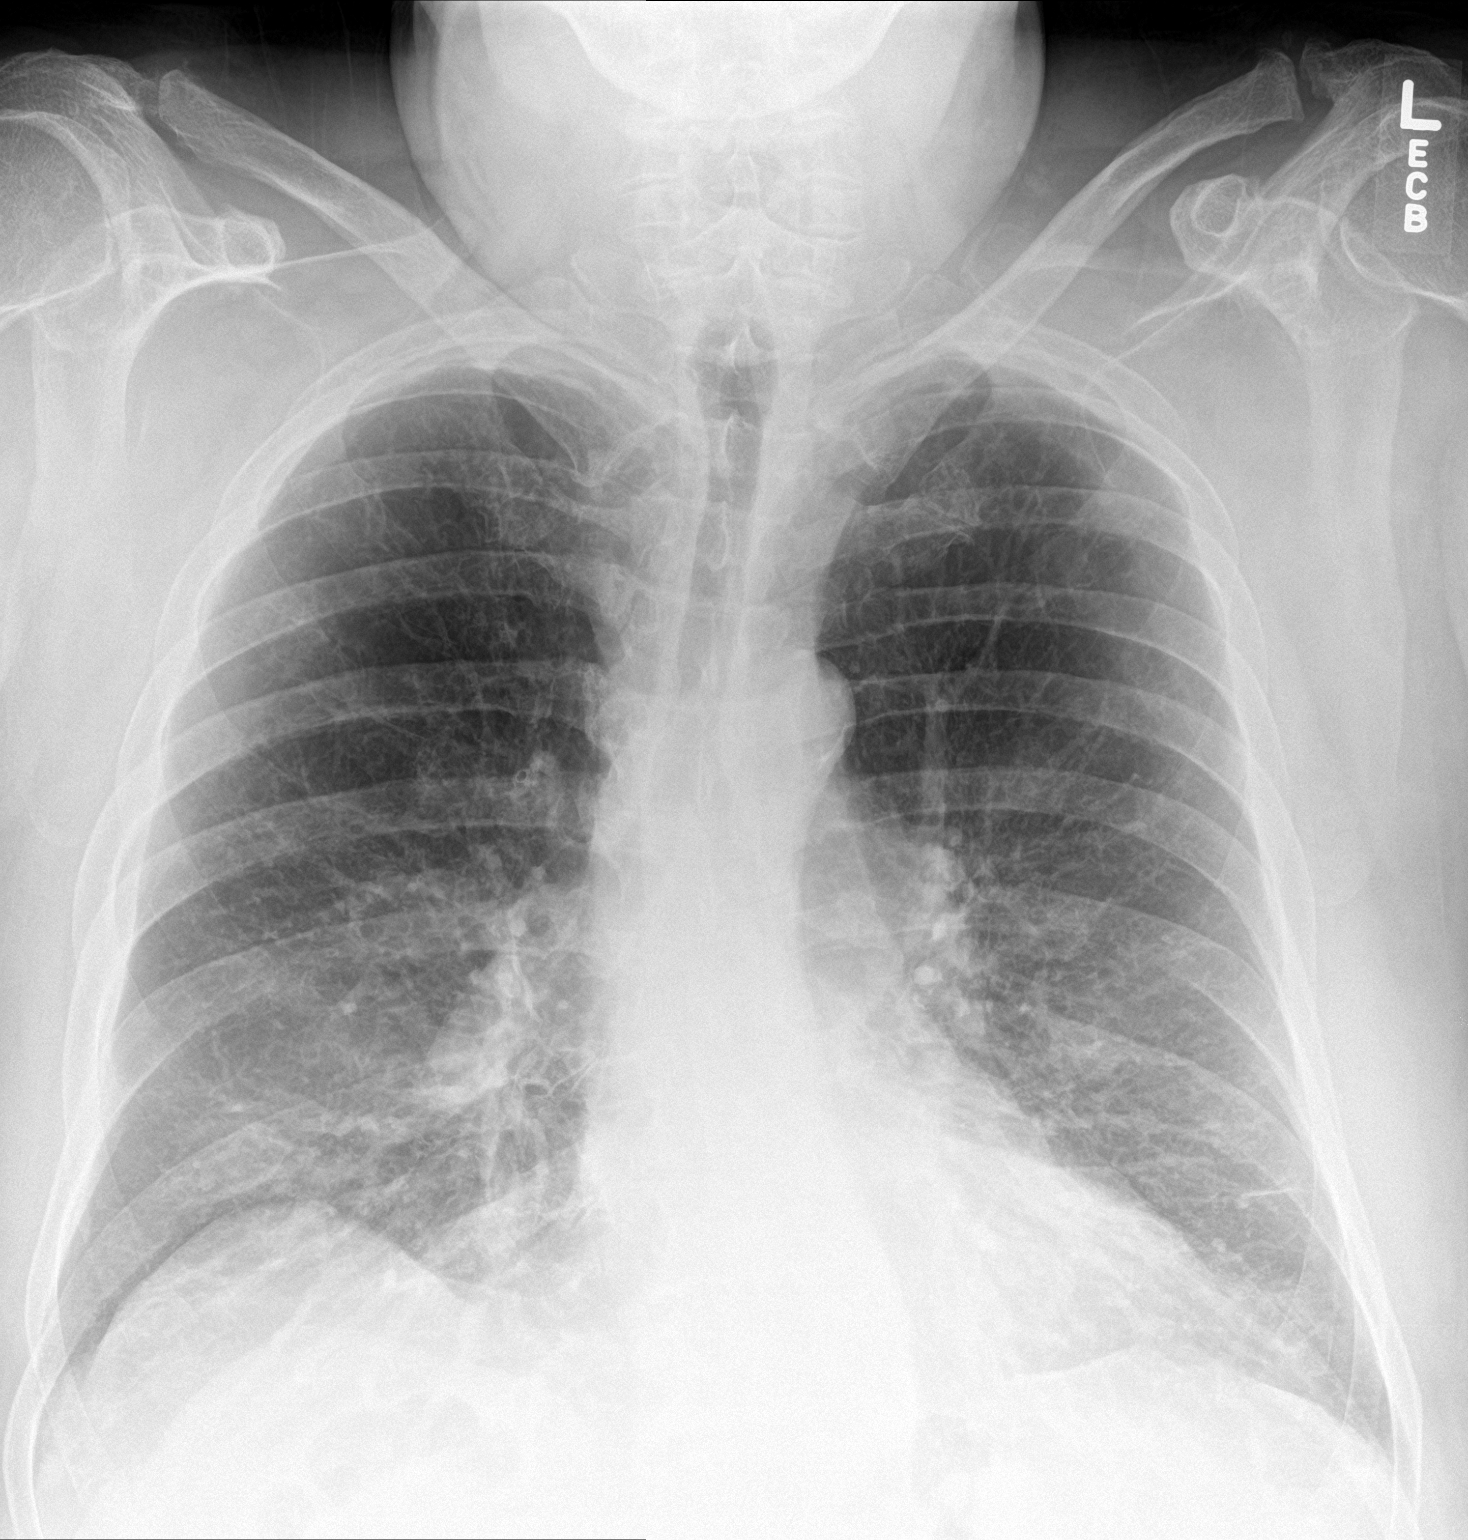

[chest lat]
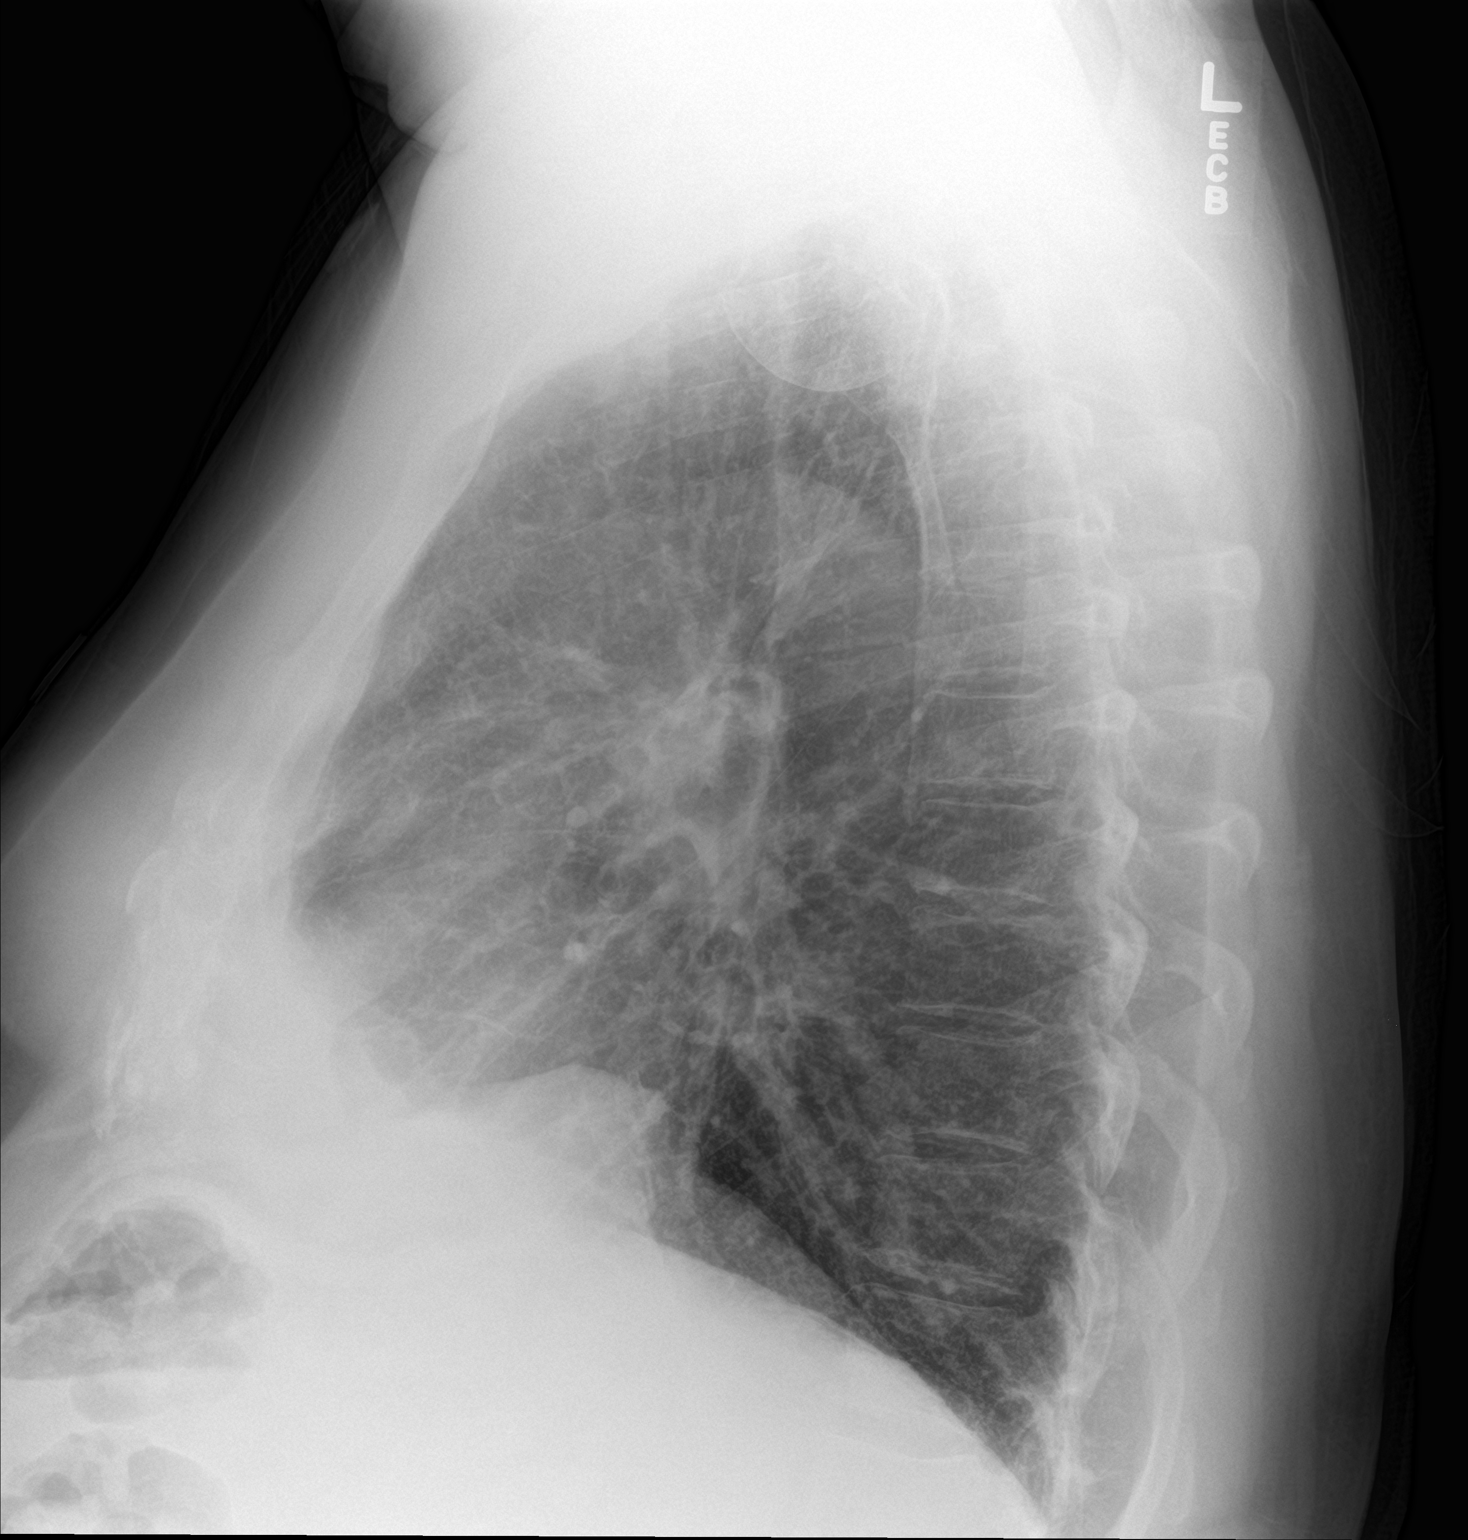

[2 of 2 positions shown; findings below may reference images not displayed]

FINDINGS: Two views study shows hyperexpansion. Interstitial markings are
diffusely coarsened with chronic features. Patchy airspace disease
noted both lung bases. Cardiopericardial silhouette is at upper
limits of normal for size. The visualized bony structures of the
thorax are intact.
IMPRESSION: Emphysema with underlying chronic interstitial changes.

Bibasilar patchy alveolar disease.  Pneumonia not excluded.

## 2019-02-20 ENCOUNTER — Encounter: Payer: Self-pay | Admitting: Internal Medicine

## 2019-07-25 LAB — LIPID PANEL
Cholesterol: 124 (ref 0–200)
HDL: 47 (ref 35–70)
LDL Cholesterol: 59
Triglycerides: 91 (ref 40–160)

## 2019-07-25 LAB — TSH: TSH: 1.38 (ref 0.41–5.90)

## 2019-07-25 LAB — HEMOGLOBIN A1C: Hemoglobin A1C: 5.6

## 2019-07-25 LAB — BASIC METABOLIC PANEL
BUN: 28 — AB (ref 4–21)
Creatinine: 0.8 (ref 0.6–1.3)

## 2019-08-08 ENCOUNTER — Other Ambulatory Visit: Payer: Self-pay

## 2019-08-08 ENCOUNTER — Telehealth: Payer: Self-pay | Admitting: "Endocrinology

## 2019-08-08 ENCOUNTER — Encounter: Payer: Self-pay | Admitting: "Endocrinology

## 2019-08-08 ENCOUNTER — Ambulatory Visit (INDEPENDENT_AMBULATORY_CARE_PROVIDER_SITE_OTHER): Payer: Medicare Other | Admitting: "Endocrinology

## 2019-08-08 NOTE — Patient Instructions (Signed)

## 2019-08-08 NOTE — Telephone Encounter (Signed)
Patient was informed at check in that we need his Social Security Number for [employer] and explained to the patient why we need that, patient refused and said he is not giving his social security to anyone.

## 2019-08-08 NOTE — Progress Notes (Signed)
Endocrinology Consult Note                                            08/08/2019, 1:25 PM   Subjective:    Patient ID: Johnny Bailey, male    DOB: [birth year], PCP Pomposini, Cherly Anderson, MD   Past Medical History:  Diagnosis Date  . Cancer ([place])    bladder  . Diabetes mellitus without complication ([place])   . High cholesterol   . Hypertension   . Hypertension   . Sciatica    Past Surgical History:  Procedure Laterality Date  . bladder cancer    . CORONARY STENT PLACEMENT     Social History   Socioeconomic History  . Marital status: Married    Spouse name: Lelan Pons  . Number of children: 4  . Years of education: BME  . Highest education level: Not on file  Occupational History  . Occupation: RETIRED    Comment: retired  Tobacco Use  . Smoking status: Former Research scientist (life sciences)  . Smokeless tobacco: Never Used  Substance and Sexual Activity  . Alcohol use: Yes    Comment: moderate  . Drug use: No  . Sexual activity: Not Currently  Other Topics Concern  . Not on file  Social History Narrative   No caffeine,is right handed and resides with wife   Social Determinants of Health   Financial Resource Strain:   . Difficulty of Paying Living Expenses: Not on file  Food Insecurity:   . Worried About Charity fundraiser in the Last Year: Not on file  . Ran Out of Food in the Last Year: Not on file  Transportation Needs:   . Lack of Transportation (Medical): Not on file  . Lack of Transportation (Non-Medical): Not on file  Physical Activity:   . Days of Exercise per Week: Not on file  . Minutes of Exercise per Session: Not on file  Stress:   . Feeling of Stress : Not on file  Social Connections:   . Frequency of Communication with Friends and Family: Not on file  . Frequency of Social Gatherings with Friends and Family: Not on file  . Attends Religious Services: Not on file  . Active Member of Clubs or Organizations: Not on file  . Attends Archivist  Meetings: Not on file  . Marital Status: Not on file   Family History  Problem Relation Age of Onset  . Cancer Mother   . Hypertension Father   . Hyperlipidemia Father   . Heart attack Father   . Heart failure Father    Outpatient Encounter Medications as of 08/08/2019  Medication Sig  . ezetimibe (ZETIA) 10 MG tablet Take 10 mg by mouth daily.  Marland Kitchen aspirin 325 MG tablet Take 325 mg by mouth daily.  Marland Kitchen atorvastatin (LIPITOR) 40 MG tablet Take 40 mg by mouth daily.  . benazepril (LOTENSIN) 20 MG tablet Take 20 mg by mouth daily.  . Flaxseed, Linseed, (FLAX SEED OIL PO) Take 1 capsule by mouth daily.  . hydrochlorothiazide (HYDRODIURIL) 25 MG tablet Take 25 mg by mouth daily.  . metoprolol (LOPRESSOR) 50 MG tablet Take 50 mg by mouth daily.  . Omega-3 Fatty Acids (FISH OIL PO) Take 1 capsule by mouth daily.  Marland Kitchen omeprazole (PRILOSEC) 20 MG capsule Take 20 mg by mouth daily.  Marland Kitchen  ondansetron (ZOFRAN) 4 MG tablet Take 1 tablet (4 mg total) by mouth every 6 (six) hours.  Marland Kitchen oseltamivir (TAMIFLU) 75 MG capsule Take 1 capsule (75 mg total) by mouth every 12 (twelve) hours.   No facility-administered encounter medications on file as of 08/08/2019.   ALLERGIES: Allergies  Allergen Reactions  . Morphine And Related Itching and Other (See Comments)    IV injection gave red streaks on arm  . Morphine Other (See Comments)    Red streak on arm     VACCINATION STATUS: Immunization History  Administered Date(s) Administered  . Influenza,inj,quad, With Preservative 05/20/2018, 05/28/2019  . Pneumococcal Conjugate-13 01/16/2019  . Pneumococcal Polysaccharide-23 10/19/2012    HPI Johnny Bailey is 73 y.o. male who presents today with a medical history as above. he is being seen in consultation for weight management requested by Pomposini, Cherly Anderson, MD.  History is obtained directly from the patient.  He reports that he has dealt with weight problems for most of his adult life, however  has observed more rapid weight gain over the last 2 to 3 years.   -He has gained approximately 20 pounds over the last year. -He did not have a chance to consult with a dietitian, tried to manage his diet as best he can.  He did not have access for any weight loss medication. He does not have diabetes, does have family history of diabetes and coronary artery disease. -He does not have any history of thyroid, adrenal, or any other endocrine dysfunction.  Patient personally also has coronary artery disease status post stent placements in 2001.  He is on treatment for hypertension, hyperlipidemia. He consumes alcohol regularly 2-3 a day .  He is a former smoker. -He is willing to explore his options of weight management.  Review of Systems  Constitutional: + weight gain, no fatigue, no subjective hyperthermia, no subjective hypothermia Eyes: no blurry vision, no xerophthalmia ENT: no sore throat, no nodules palpated in throat, no dysphagia/odynophagia, no hoarseness Cardiovascular: no Chest Pain, no Shortness of Breath, no palpitations, no leg swelling Respiratory: no cough, no shortness of breath Gastrointestinal: no Nausea/Vomiting/Diarhhea Musculoskeletal: no muscle/joint aches Skin: no rashes Neurological: no tremors, no numbness, no tingling, no dizziness Psychiatric: no depression, no anxiety  Objective:    BP (!) 184/124 (BP Location: Right Arm, Patient Position: Sitting, Cuff Size: Normal)   Pulse 69   Temp 97.6 F (36.4 C) (Oral)   Ht 6\' 1"  (1.854 m)   Wt (!) 316 lb 6.4 oz (143.5 kg)   SpO2 96%   BMI 41.74 kg/m   Wt Readings from Last 3 Encounters:  08/08/19 (!) 316 lb 6.4 oz (143.5 kg)  04/01/14 274 lb (124.3 kg)  03/23/14 280 lb (127 kg)    Physical Exam  Constitutional:  Body mass index is 41.74 kg/m.,  not in acute distress, normal state of mind Eyes:  EOMI, no exophthalmos Neck: Supple Respiratory: Adequate breathing efforts Musculoskeletal: no gross  deformities, strength intact in all four extremities, no gross restriction of joint movements Skin:  no rashes, no hyperemia Neurological: no tremor with outstretched hands.  CMP ( most recent) CMP     Component Value Date/Time   NA 135 09/11/2016 1444   K 3.8 09/11/2016 1444   CL 101 09/11/2016 1444   CO2 24 09/11/2016 1444   GLUCOSE 111 (H) 09/11/2016 1444   BUN 28 (A) 07/25/2019 0000   CREATININE 0.8 07/25/2019 0000   CREATININE 0.91 09/11/2016 1444  CALCIUM 9.2 09/11/2016 1444   PROT 6.8 04/01/2014 1018   GFRNONAA >60 09/11/2016 1444   GFRAA >60 09/11/2016 1444     Lab Results  Component Value Date   TSH 1.38 07/25/2019   TSH 1.360 04/01/2014    Recent Results (from the past 2160 hour(s))  Basic metabolic panel     Status: Abnormal   Collection Time: 07/25/19 12:00 AM  Result Value Ref Range   BUN 28 (A) 4 - 21   Creatinine 0.8 0.6 - 1.3  Lipid panel     Status: None   Collection Time: 07/25/19 12:00 AM  Result Value Ref Range   Triglycerides 91 40 - 160   Cholesterol 124 0 - 200   HDL 47 35 - 70   LDL Cholesterol 59   Hemoglobin A1c     Status: None   Collection Time: 07/25/19 12:00 AM  Result Value Ref Range   Hemoglobin A1C 5.6   TSH     Status: None   Collection Time: 07/25/19 12:00 AM  Result Value Ref Range   TSH 1.38 0.41 - 5.90    Comment: t4 6.2     Assessment & Plan:   1. Morbid obesity ([place])  - Johnny Bailey  is being seen at a kind request of Pomposini, Cherly Anderson, MD. - I have reviewed his available endocrine records and clinically evaluated the patient. - Based on these reviews, he has longstanding weight problem without any gross endocrine deficit.  A deeper  review of his medical records, do not show diabetes/prediabetes, thyroid dysfunction.  However he does have comorbidities including coronary artery disease, dyslipidemia, hypertension likely related to his body weight.  -He is concerned and rightly so.  He would benefit the  most from weight loss. -I had a long discussion about options of weight management with him.  The first and foremost is dietary rearrangement/modification.  - he  admits there is a room for improvement in his diet and drink choices. -  Suggestion is made for him to avoid simple carbohydrates  from his diet including Cakes, Sweet Desserts / Pastries, Ice Cream, Soda (diet and regular), Sweet Tea, Candies, Chips, Cookies, Sweet Pastries,  Store Bought Juices, Alcohol in Excess of  1-2 drinks a day, Artificial Sweeteners, Coffee Creamer, and "Sugar-free" Products. This will help patient to have stable blood glucose profile and potentially avoid unintended weight gain.  -He will also be referred to Jearld Fenton,  RDN, CDE, for individualized dietary education.   -An exercise regimen of long walks is recommended to him.   -He will not benefit significantly from recently approved weight loss medication.   His insurance would not cover Saxenda.  -His best course of action would be bariatric surgery.  I discussed this option in more detail with him.  -I gave him information brochure and contact numbers to initiate this consult with Johnny Bailey bariatric team.   -Given the fact that he has  about  150 pounds to lose to get to ideal weight territory , the earlier he get this intervention the better it is for him in the long-term.   - I did not initiate any new prescriptions today.  His blood pressure is not controlled to target, he does have enough prescription medications including benazepril 20 mg p.o. daily, hydrochlorothiazide 25 mg p.o. daily, metoprolol 50 mg p.o. daily. - he is advised to maintain close follow up with Pomposini, Cherly Anderson, MD for primary care needs.  - Time  spent with the patient: 45 minutes, of which >50% was spent in obtaining information about his symptoms, reviewing his previous labs/studies,  evaluations, and treatments, counseling him about his morbid obesity and the  benefit of weight loss, and developing a plan  For  long term treatment based on the latest standards of care/guidelines.    Johnny Bailey participated in the discussions, expressed understanding, and voiced agreement with the above plans.  All questions were answered to his satisfaction. he is encouraged to contact clinic should he have any questions or concerns prior to his return visit.  Follow up plan: Return in about 3 months (around 11/06/2019) for Follow up with Pre-visit Labs, Next Visit A1c in Office.   Glade Lloyd, MD [facility] [address] [place], [place] [address] Phone: [phone]  Fax: [phone]     08/08/2019, 1:25 PM  This note was partially dictated with voice recognition software. Similar sounding words can be transcribed inadequately or may not  be corrected upon review.

## 2019-10-01 ENCOUNTER — Other Ambulatory Visit: Payer: Self-pay

## 2019-10-01 ENCOUNTER — Encounter: Payer: Medicare Other | Attending: "Endocrinology | Admitting: Nutrition

## 2019-10-01 ENCOUNTER — Encounter: Payer: Self-pay | Admitting: Nutrition

## 2019-10-01 DIAGNOSIS — K219 Gastro-esophageal reflux disease without esophagitis: Secondary | ICD-10-CM | POA: Diagnosis not present

## 2019-10-01 DIAGNOSIS — Z6841 Body Mass Index (BMI) 40.0 and over, adult: Secondary | ICD-10-CM | POA: Diagnosis not present

## 2019-10-01 DIAGNOSIS — I259 Chronic ischemic heart disease, unspecified: Secondary | ICD-10-CM | POA: Insufficient documentation

## 2019-10-01 DIAGNOSIS — E785 Hyperlipidemia, unspecified: Secondary | ICD-10-CM | POA: Insufficient documentation

## 2019-10-01 DIAGNOSIS — I10 Essential (primary) hypertension: Secondary | ICD-10-CM | POA: Diagnosis not present

## 2019-10-01 DIAGNOSIS — Z8551 Personal history of malignant neoplasm of bladder: Secondary | ICD-10-CM | POA: Diagnosis not present

## 2019-10-01 DIAGNOSIS — E559 Vitamin D deficiency, unspecified: Secondary | ICD-10-CM | POA: Diagnosis not present

## 2019-10-01 DIAGNOSIS — G4730 Sleep apnea, unspecified: Secondary | ICD-10-CM | POA: Insufficient documentation

## 2019-10-01 DIAGNOSIS — R739 Hyperglycemia, unspecified: Secondary | ICD-10-CM | POA: Insufficient documentation

## 2019-10-01 DIAGNOSIS — Z79899 Other long term (current) drug therapy: Secondary | ICD-10-CM | POA: Diagnosis not present

## 2019-10-01 DIAGNOSIS — Z713 Dietary counseling and surveillance: Secondary | ICD-10-CM | POA: Diagnosis present

## 2019-10-01 NOTE — Progress Notes (Signed)
  Medical Nutrition Therapy:  Appt start time: 1500 end time:  1600.   Assessment:  Primary concerns today: Obesity. BMI 15 LIves with his wife.  HIs wife cooks. Eats three meals a day, sometimes only 2 meals. Wants to lose weight and improve his DM. Has CAD, Dyslipidemia.  He notes he tends to snack at night. Sometimes skips lunch. He may be interested in bariatric surgery. He does tend to drink alcohol and eat a lot of appetizers before dinner meal. Tends to eat later in evening. Drinks 2-3 hard drinks or 3-4 glasses of wine/beer.  He has been trying to eat better recently. Hasn't been able to exercise due to covid but wiling to be more active.  Current diet reveals excessive calorie intake and lack of exercise for desired weight loss.  CMP Latest Ref Rng & Units 07/25/2019 09/11/2016 04/01/2014  Glucose 65 - 99 mg/dL - 111(H) -  BUN 4 - 21 28(A) 21(H) -  Creatinine 0.6 - 1.3 0.8 0.91 -  Sodium 135 - 145 mmol/L - 135 -  Potassium 3.5 - 5.1 mmol/L - 3.8 -  Chloride 101 - 111 mmol/L - 101 -  CO2 22 - 32 mmol/L - 24 -  Calcium 8.9 - 10.3 mg/dL - 9.2 -  Total Protein 6.0 - 8.5 g/dL - - 6.8   Lipid Panel     Component Value Date/Time   CHOL 124 07/25/2019 0000   TRIG 91 07/25/2019 0000   HDL 47 07/25/2019 0000   LDLCALC 59 07/25/2019 0000     Preferred Learning Style:   No preference indicated   Learning Readiness:   Ready  Change in progress   MEDICATIONS:   DIETARY INTAKE:   24-hr recall:  B ( AM): 2 pieces of fruit; orange and grapes, 10 am bran flakes  With milk, and whole banana Snk ( AM):   L ( PM):  Chunky vegetables soup,  water Snk ( PM): nuts and burbon/coke 2 D ( PM):burritos 2, taco 1 wine 4-6 oz Snk ( PM): mixed drinks or nuts/appetizier stuff or social snacks. Beverages: water 2 bottes per day  Usual physical activity: split wood twice a week.  Estimated energy needs: 1800 calories 200 g carbohydrates 135 g protein 50 g fat  Progress Towards  Goal(s):  In progress.   Nutritional Diagnosis:  NB-1.1 Food and nutrition-related knowledge deficit As related to morbid obesity and excessive energy intake  As evidenced by BMI of 41 and diet recall    Intervention:  Nutrition Weight loss tips, portion sizes, meal planning, avoiding snacks, empty calories from alcohol and snacks, benefits of 60 minutes of exercise 3-4 times per week. Water intake of gallon per day. Avoiding salty and processed foods. Emotional eating..  Goals Eat 3 meals per day Cut out early fruit and just eat with breakfast Cut out on appetizers before meals Cut down on alcohol. Increase lower carb vegetables with meals Exercise 60 minutes 3 times per week Drink 5 bottles of water per day and increase to a gallon of water  Teaching Method Utilized:  Visual Auditory Hands on  Handouts given during visit include:  My Plate  Weight loss tips Barriers to learning/adherence to lifestyle change: None  Demonstrated degree of understanding via:  Teach Back   Monitoring/Evaluation:  Dietary intake, exercise, , and body weight in 1 month(s).

## 2019-10-14 ENCOUNTER — Encounter: Payer: Self-pay | Admitting: Nutrition

## 2019-10-14 NOTE — Patient Instructions (Signed)
Goals Eat 3 meals per day Cut out early fruit and just eat with breakfast Cut out on appetizers before meals Cut down on alcohol. Increase lower carb vegetables with meals Exercise 60 minutes 3 times per week Drink 5 bottles of water per day and increase to a gallon of water

## 2019-10-15 ENCOUNTER — Encounter: Payer: Self-pay | Admitting: Nutrition

## 2019-11-03 ENCOUNTER — Other Ambulatory Visit: Payer: Self-pay

## 2019-11-03 ENCOUNTER — Encounter: Payer: Medicare Other | Attending: "Endocrinology | Admitting: Nutrition

## 2019-11-03 ENCOUNTER — Encounter: Payer: Self-pay | Admitting: Nutrition

## 2019-11-03 DIAGNOSIS — I10 Essential (primary) hypertension: Secondary | ICD-10-CM | POA: Insufficient documentation

## 2019-11-03 DIAGNOSIS — R739 Hyperglycemia, unspecified: Secondary | ICD-10-CM | POA: Insufficient documentation

## 2019-11-03 DIAGNOSIS — E782 Mixed hyperlipidemia: Secondary | ICD-10-CM | POA: Insufficient documentation

## 2019-11-03 NOTE — Progress Notes (Signed)
  Medical Nutrition Therapy:  Appt start time: 930 end time:  1000  Assessment:  Primary concerns today: Obesity. BMI 11 LIves with his wife.  Lost 1 lb. Skips breakfast and eats a large bowl of bran flakes for lunch and then may have appitizer and few drinks after supper. Has cut out snacking . Frustrated he hasn't lost more weight. Not exercising. Not eating enough lower carb vegetables or protein. Going to help with spring training this week in [facility] and will get more exercise. Admits he needs to work on portion control and eat 3 balanced meals instead of most of his food late at night. CMP Latest Ref Rng & Units 07/25/2019 09/11/2016 04/01/2014  Glucose 65 - 99 mg/dL - 111(H) -  BUN 4 - 21 28(A) 21(H) -  Creatinine 0.6 - 1.3 0.8 0.91 -  Sodium 135 - 145 mmol/L - 135 -  Potassium 3.5 - 5.1 mmol/L - 3.8 -  Chloride 101 - 111 mmol/L - 101 -  CO2 22 - 32 mmol/L - 24 -  Calcium 8.9 - 10.3 mg/dL - 9.2 -  Total Protein 6.0 - 8.5 g/dL - - 6.8   Lipid Panel     Component Value Date/Time   CHOL 124 07/25/2019 0000   TRIG 91 07/25/2019 0000   HDL 47 07/25/2019 0000   LDLCALC 59 07/25/2019 0000     Preferred Learning Style:   No preference indicated   Learning Readiness:   Ready  Change in progress   MEDICATIONS:   DIETARY INTAKE:   24-hr recall:  B ( AM): grapes and orange and coffee  Snk ( AM):   L ( PM):   3 cups Bran flakes and banana, dried cranberries, 1% milk, Chunky vegetables soup,  water Snk ( PM): nuts and burbon/coke 2 D ( PM):burritos 2, taco 1 wine 4-6 oz Snk ( PM): mixed drinks or nuts/appetizier stuff or social snacks. Beverages: water 2 bottes per day  Usual physical activity: split wood twice a week.  Estimated energy needs: 1800 calories 200 g carbohydrates 135 g protein 50 g fat  Progress Towards Goal(s):  In progress.   Nutritional Diagnosis:  NB-1.1 Food and nutrition-related knowledge deficit As related to morbid obesity and excessive energy  intake  As evidenced by BMI of 41 and diet recall    Intervention:  Nutrition Weight loss tips, portion sizes, meal planning, avoiding snacks, empty calories from alcohol and snacks, benefits of 60 minutes of exercise 3-4 times per week. Water intake of gallon per day. Avoiding salty and processed foods. Emotional eating..  Goals  Eat three balanced meals per day Eat breakfast daily. Reduce bran flakes to 1 cup when having with eggs with breakfast Walk 15 minutes twice a day- get with trainer in [facility] for a work out routine. Lose 2-3 lbs per week Drink water only. Increase lower carb vegetables with lunch and dinner   Teaching Method Utilized:  Visual Auditory Hands on  Handouts given during visit include:  My Plate  Weight loss tips Barriers to learning/adherence to lifestyle change: None  Demonstrated degree of understanding via:  Teach Back   Monitoring/Evaluation:  Dietary intake, exercise, , and body weight in 2 month(s).

## 2019-11-03 NOTE — Patient Instructions (Addendum)
Goals  Eat three balanced meals per day Eat breakfast daily. Reduce bran flakes to 1 cup when having with eggs with breakfast Walk 15 minutes twice a day- get with trainer in [facility] for a work out routine. Lose 2-3 lbs per week Drink water only. Increase lower carb vegetables with lunch and dinner

## 2019-11-06 ENCOUNTER — Encounter: Payer: Self-pay | Admitting: Nutrition

## 2019-11-10 ENCOUNTER — Ambulatory Visit: Payer: Medicare Other | Admitting: "Endocrinology

## 2020-01-05 ENCOUNTER — Ambulatory Visit: Payer: No Typology Code available for payment source | Admitting: Nutrition

## 2020-06-09 ENCOUNTER — Other Ambulatory Visit: Payer: Self-pay

## 2020-06-09 ENCOUNTER — Ambulatory Visit (INDEPENDENT_AMBULATORY_CARE_PROVIDER_SITE_OTHER): Payer: Medicare Other | Admitting: Orthopedic Surgery

## 2020-06-09 DIAGNOSIS — M75101 Unspecified rotator cuff tear or rupture of right shoulder, not specified as traumatic: Secondary | ICD-10-CM | POA: Diagnosis not present

## 2020-06-16 ENCOUNTER — Encounter: Payer: Self-pay | Admitting: Orthopedic Surgery

## 2020-06-16 NOTE — Progress Notes (Signed)
Office Visit Note   Patient: Johnny Bailey           Date of Birth: 1946-08-15           MRN: [id number] Visit Date: 06/09/2020 Requested by: Clinton Quant, MD No address on file PCP: Pomposini, Cherly Anderson, MD  Subjective: Chief Complaint  Patient presents with  . Right Shoulder - Pain    HPI: Johnny Bailey is a 74 year old patient with 1 year history of atraumatic onset right shoulder pain.  Reports constant pain with activity along with decreased range of motion.  The pain does wake him from sleep at night.  He usually takes Tylenol.  The pain radiates to the elbow.  He did have an injection in May with good relief.  He does do a lot of heavy lifting.  The pain has been particularly severe over the last several weeks.  His pain is a more prominent symptom than weakness.  He has had prior radiographs of the shoulder which showed maintenance of joint space and minimal arthritis.              ROS: All systems reviewed are negative as they relate to the chief complaint within the history of present illness.  Patient denies  fevers or chills.   Assessment & Plan: Visit Diagnoses:  1. Tear of right rotator cuff, unspecified tear extent, unspecified whether traumatic     Plan: Impression is right shoulder rotator cuff tear.  Symptoms have been ongoing for a year.  Does have some weakness to infraspinatus and supraspinatus testing.  No evidence of frozen shoulder at this time.  No clear-cut radicular symptoms below the elbow.  Due to duration of symptoms and failure of conservative management plan MRI arthrogram of the right shoulder to evaluate rotator cuff tear.  Follow-up after that study.  This may be a surgical problem.  Follow-Up Instructions: No follow-ups on file.   Orders:  Orders Placed This Encounter  Procedures  . MR SHOULDER RIGHT W CONTRAST  . Arthrogram   No orders of the defined types were placed in this encounter.     Procedures: No procedures  performed   Clinical Data: No additional findings.  Objective: Vital Signs: There were no vitals taken for this visit.  Physical Exam:   Constitutional: Patient appears well-developed HEENT:  Head: Normocephalic Eyes:EOM are normal Neck: Normal range of motion Cardiovascular: Normal rate Pulmonary/chest: Effort normal Neurologic: Patient is alert Skin: Skin is warm Psychiatric: Patient has normal mood and affect    Ortho Exam: Ortho exam demonstrates full active and passive range of motion of the cervical spine.  No scapular dyskinesia with forward flexion.  On the right-hand side the patient has symmetric external rotation of both shoulders about 45 degrees with passive range of motion.  Does have a little weakness to infraspinatus of stress testing on the right compared to the left but no discrete AC joint tenderness.  Negative apprehension.  Negative O'Brien's testing on the right.  Motor sensory function of the hand is intact with no paresthesias C5-T1.  Specialty Comments:  No specialty comments available.  Imaging: No results found.   PMFS History: Patient Active Problem List   Diagnosis Date Noted  . Morbid obesity ([place]) 08/08/2019   Past Medical History:  Diagnosis Date  . Cancer ([place])    bladder  . Diabetes mellitus without complication ([place])   . High cholesterol   . Hypertension   . Hypertension   . Sciatica  Family History  Problem Relation Age of Onset  . Cancer Mother   . Hypertension Father   . Hyperlipidemia Father   . Heart attack Father   . Heart failure Father     Past Surgical History:  Procedure Laterality Date  . bladder cancer    . CORONARY STENT PLACEMENT     Social History   Occupational History  . Occupation: RETIRED    Comment: retired  Tobacco Use  . Smoking status: Former Research scientist (life sciences)  . Smokeless tobacco: Never Used  Substance and Sexual Activity  . Alcohol use: Yes    Comment: moderate  . Drug use: No  . Sexual  activity: Not Currently
